# Patient Record
Sex: Male | Born: 2005 | Race: White | Hispanic: No | Smoking: Never smoker
Health system: Southern US, Community
[De-identification: ages and names within clinical notes are randomized; demographics above are authoritative.]

## PROBLEM LIST (undated history)

## (undated) HISTORY — PX: TONSILLECTOMY AND ADENOIDECTOMY: SHX28

## (undated) HISTORY — PX: CYSTOSCOPY: SUR368

---
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
## 2024-02-17 DIAGNOSIS — L853 Xerosis cutis: Secondary | ICD-10-CM | POA: Diagnosis not present

## 2024-02-17 DIAGNOSIS — L700 Acne vulgaris: Secondary | ICD-10-CM | POA: Diagnosis not present

## 2024-02-17 DIAGNOSIS — K130 Diseases of lips: Secondary | ICD-10-CM | POA: Diagnosis not present

## 2024-05-02 DIAGNOSIS — K130 Diseases of lips: Secondary | ICD-10-CM | POA: Diagnosis not present

## 2024-05-02 DIAGNOSIS — L853 Xerosis cutis: Secondary | ICD-10-CM | POA: Diagnosis not present

## 2024-05-02 DIAGNOSIS — B078 Other viral warts: Secondary | ICD-10-CM | POA: Diagnosis not present

## 2024-05-02 DIAGNOSIS — L700 Acne vulgaris: Secondary | ICD-10-CM | POA: Diagnosis not present

## 2024-05-29 DIAGNOSIS — B078 Other viral warts: Secondary | ICD-10-CM | POA: Diagnosis not present

## 2024-05-29 DIAGNOSIS — R238 Other skin changes: Secondary | ICD-10-CM | POA: Diagnosis not present

## 2024-05-29 DIAGNOSIS — Z789 Other specified health status: Secondary | ICD-10-CM | POA: Diagnosis not present

## 2024-05-29 DIAGNOSIS — L538 Other specified erythematous conditions: Secondary | ICD-10-CM | POA: Diagnosis not present
